# Patient Record
Sex: Male | Born: 1970 | Race: Black or African American | Hispanic: No | Marital: Single | Smoking: Current some day smoker
Health system: Southern US, Community
[De-identification: ages and names within clinical notes are randomized; demographics above are authoritative.]

## PROBLEM LIST (undated history)

## (undated) DIAGNOSIS — E7800 Pure hypercholesterolemia, unspecified: Secondary | ICD-10-CM

---

## 2011-04-27 ENCOUNTER — Emergency Department: Payer: Self-pay | Admitting: Emergency Medicine

## 2012-05-13 ENCOUNTER — Emergency Department: Payer: Self-pay | Admitting: Emergency Medicine

## 2012-05-15 ENCOUNTER — Emergency Department: Payer: Self-pay | Admitting: Emergency Medicine

## 2012-05-18 LAB — WOUND CULTURE

## 2012-12-28 ENCOUNTER — Emergency Department: Payer: Self-pay | Admitting: Emergency Medicine

## 2012-12-28 LAB — CBC
HCT: 42.4 % (ref 40.0–52.0)
HGB: 14.2 g/dL (ref 13.0–18.0)
MCH: 29.2 pg (ref 26.0–34.0)
MCHC: 33.4 g/dL (ref 32.0–36.0)
Platelet: 215 10*3/uL (ref 150–440)
RBC: 4.85 10*6/uL (ref 4.40–5.90)
WBC: 8.2 10*3/uL (ref 3.8–10.6)

## 2012-12-28 LAB — COMPREHENSIVE METABOLIC PANEL
Bilirubin,Total: 0.4 mg/dL (ref 0.2–1.0)
Co2: 29 mmol/L (ref 21–32)
Creatinine: 0.96 mg/dL (ref 0.60–1.30)
Glucose: 122 mg/dL — ABNORMAL HIGH (ref 65–99)
Osmolality: 282 (ref 275–301)
Potassium: 3.8 mmol/L (ref 3.5–5.1)
SGOT(AST): 26 U/L (ref 15–37)
SGPT (ALT): 23 U/L (ref 12–78)
Sodium: 140 mmol/L (ref 136–145)
Total Protein: 7.7 g/dL (ref 6.4–8.2)

## 2012-12-28 LAB — TROPONIN I: Troponin-I: <0.02 ng/mL

## 2013-05-29 ENCOUNTER — Emergency Department: Payer: Self-pay | Admitting: Emergency Medicine

## 2015-05-29 ENCOUNTER — Other Ambulatory Visit: Payer: Self-pay | Admitting: Family Medicine

## 2015-05-29 DIAGNOSIS — R1033 Periumbilical pain: Secondary | ICD-10-CM

## 2015-06-04 ENCOUNTER — Ambulatory Visit
Admission: RE | Admit: 2015-06-04 | Discharge: 2015-06-04 | Disposition: A | Discharge status: Home / Self Care | Payer: No Typology Code available for payment source | Source: Ambulatory Visit | Attending: Family Medicine | Admitting: Family Medicine

## 2015-06-04 ENCOUNTER — Other Ambulatory Visit: Payer: Self-pay | Admitting: Family Medicine

## 2015-06-04 DIAGNOSIS — N2889 Other specified disorders of kidney and ureter: Secondary | ICD-10-CM | POA: Diagnosis present

## 2015-06-04 DIAGNOSIS — N281 Cyst of kidney, acquired: Secondary | ICD-10-CM | POA: Diagnosis not present

## 2015-06-04 DIAGNOSIS — R1033 Periumbilical pain: Secondary | ICD-10-CM

## 2015-06-04 MED ORDER — IOHEXOL 350 MG/ML SOLN
100.0000 mL | Freq: Once | INTRAVENOUS | Status: AC | PRN
  Administered 2015-06-04: 100 mL via INTRAVENOUS

## 2015-06-11 ENCOUNTER — Ambulatory Visit
Admission: RE | Admit: 2015-06-11 | Discharge: 2015-06-11 | Disposition: A | Discharge status: Home / Self Care | Payer: No Typology Code available for payment source | Source: Ambulatory Visit | Attending: Family Medicine | Admitting: Family Medicine

## 2015-06-11 DIAGNOSIS — N281 Cyst of kidney, acquired: Secondary | ICD-10-CM | POA: Diagnosis not present

## 2015-06-11 DIAGNOSIS — N2889 Other specified disorders of kidney and ureter: Secondary | ICD-10-CM | POA: Diagnosis present

## 2015-06-11 MED ORDER — GADOBENATE DIMEGLUMINE 529 MG/ML IV SOLN
20.0000 mL | Freq: Once | INTRAVENOUS | Status: AC | PRN
  Administered 2015-06-11: 16 mL via INTRAVENOUS

## 2016-11-05 IMAGING — CT CT ABD-PELV W/ CM
2 of 5 series · 15 of 46 positions shown, 17 images · IV contrast (omnipaque)
Comparison: None.

CLINICAL DATA: Patient complains of pelvic pain for 6 months.

EXAM:
CT ABDOMEN AND PELVIS WITH CONTRAST
TECHNIQUE: Multidetector CT imaging of the abdomen and pelvis was performed
using the standard protocol following bolus administration of
intravenous contrast.
CONTRAST:  100mL OMNIPAQUE IOHEXOL 350 MG/ML SOLN

[Series 3: routine with · axial · 0.67mm/px · z∈[-1070,-670]mm · 12 of 91 slices shown, 14 images]
[im 6/91  soft-tissue]
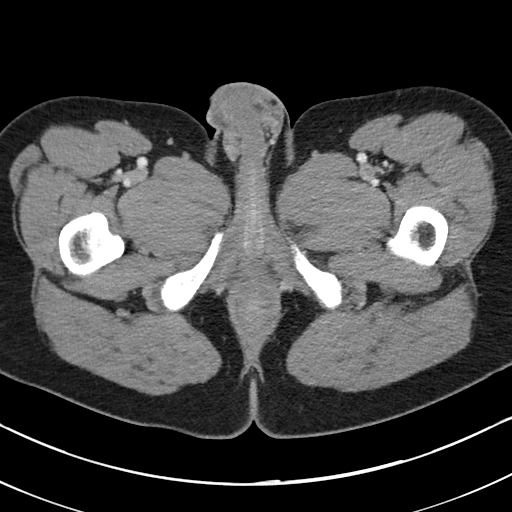
[im 6/91  bone]
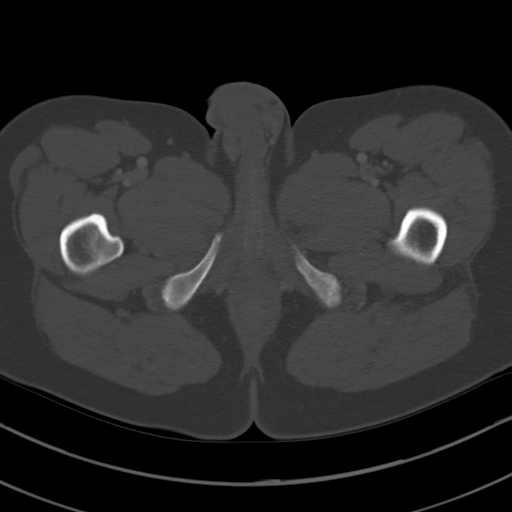
[im 16/91  soft-tissue]
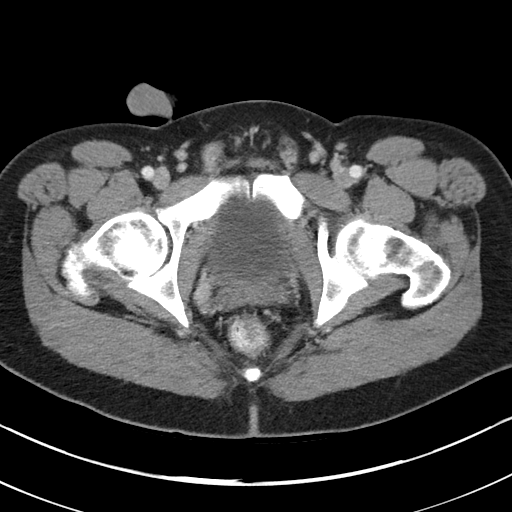
[im 21/91  soft-tissue]
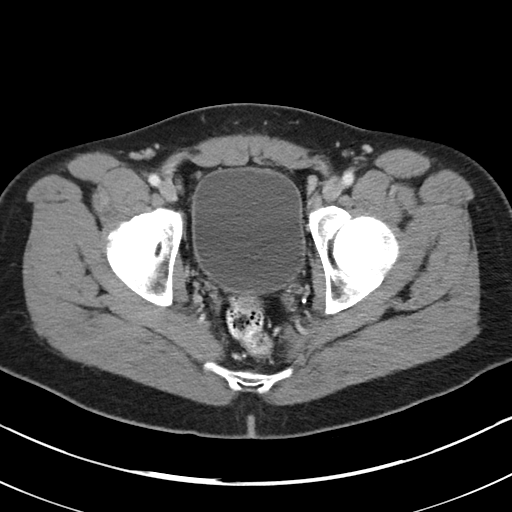
[im 26/91  soft-tissue]
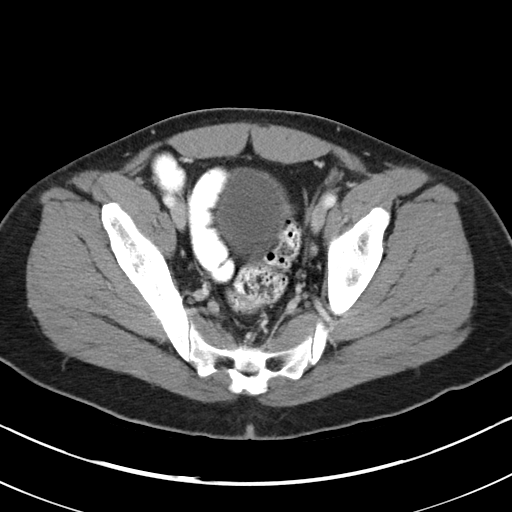
[im 36/91  soft-tissue]
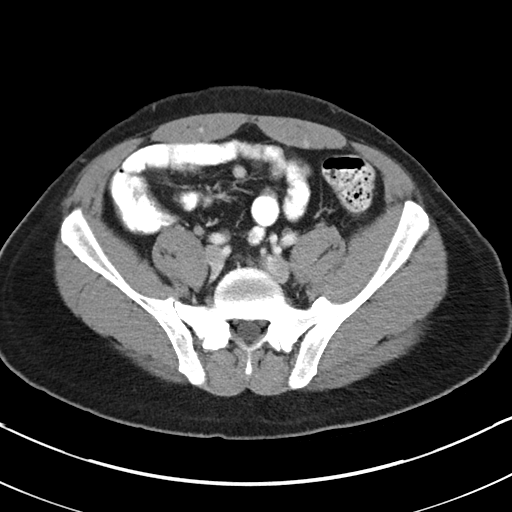
[im 41/91  soft-tissue]
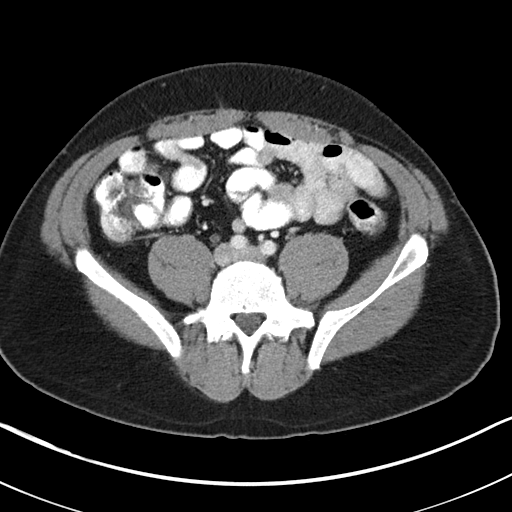
[im 51/91  soft-tissue]
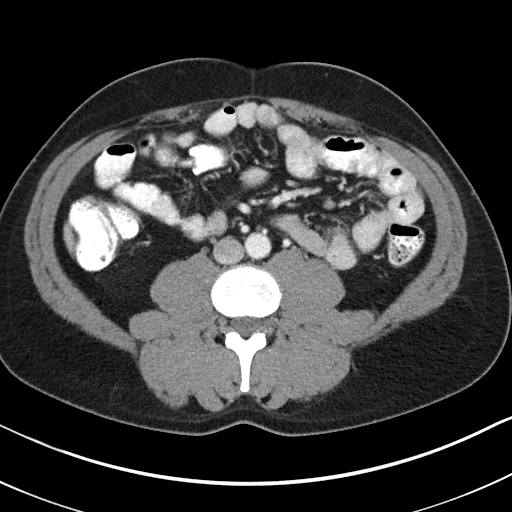
[im 56/91  soft-tissue]
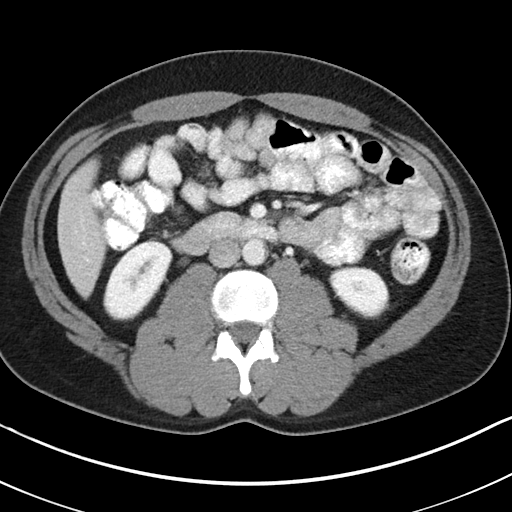
[im 66/91  soft-tissue]
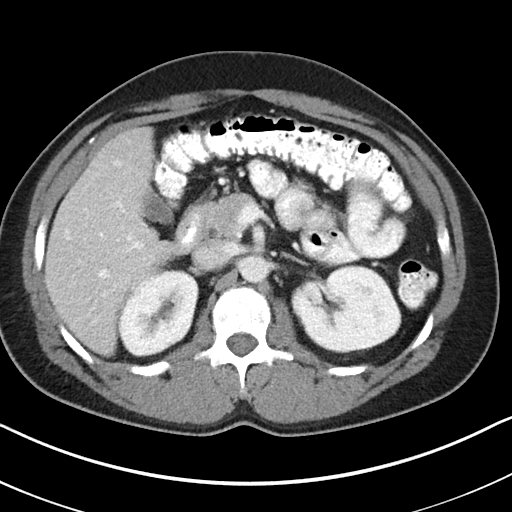
[im 66/91  bone]
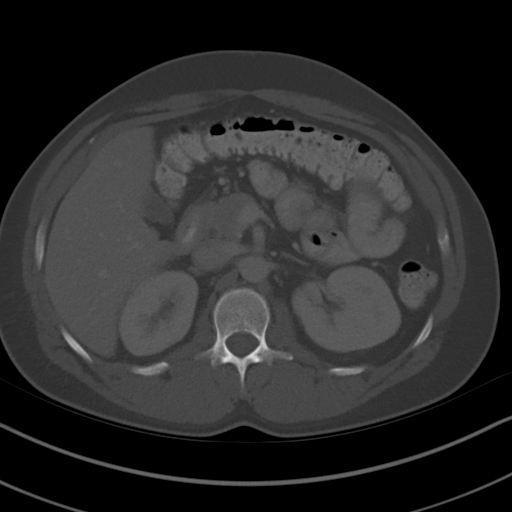
[im 71/91  soft-tissue]
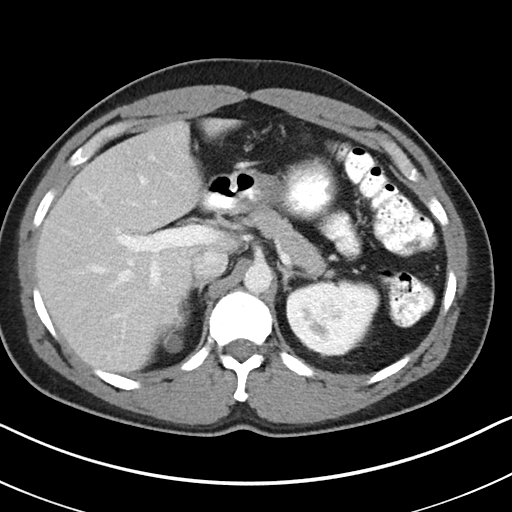
[im 76/91  soft-tissue]
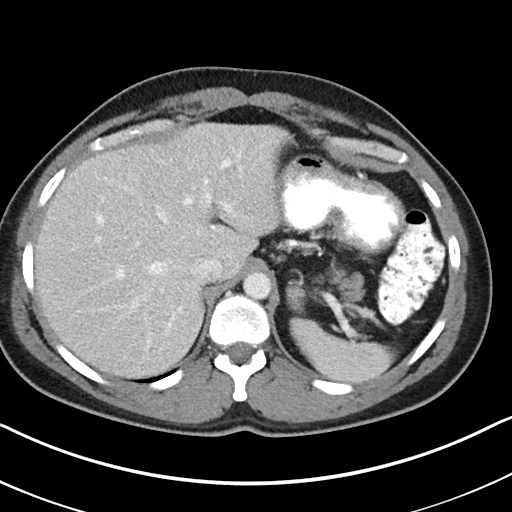
[im 86/91  soft-tissue]
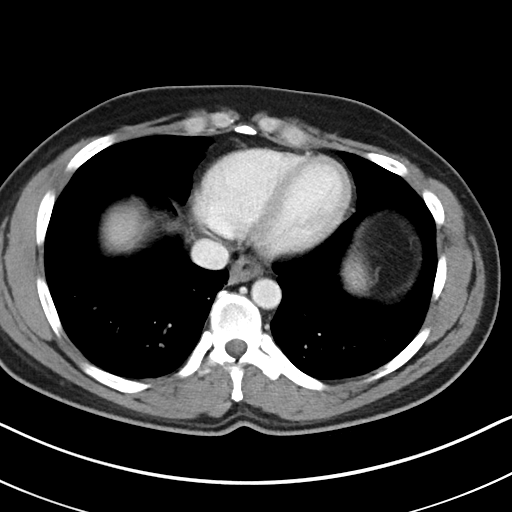

[Series 6: cor routine with · coronal · 0.62mm/px · 3 of 151 slices shown]
[im 51/151  soft-tissue]
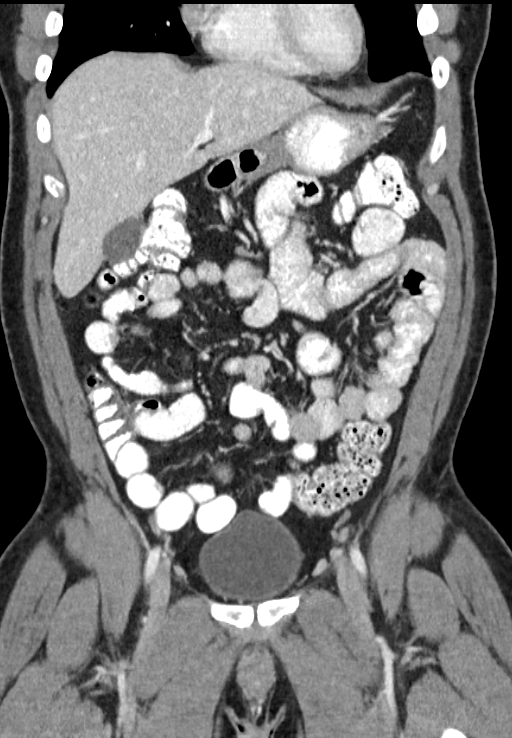
[im 67/151  soft-tissue]
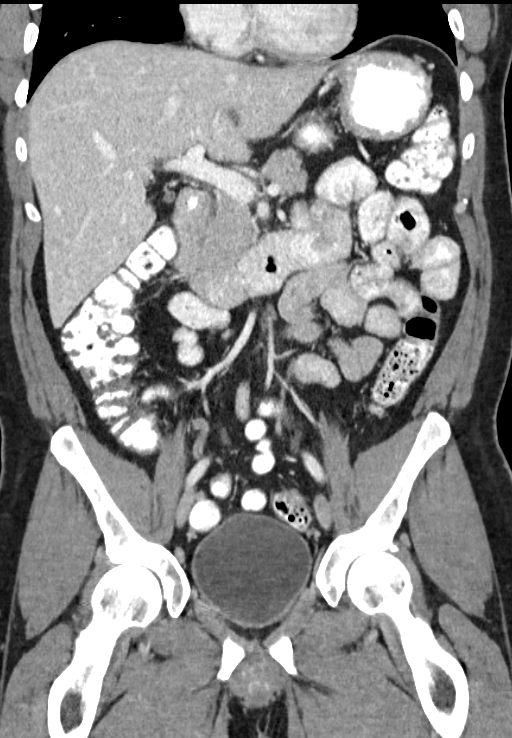
[im 84/151  soft-tissue]
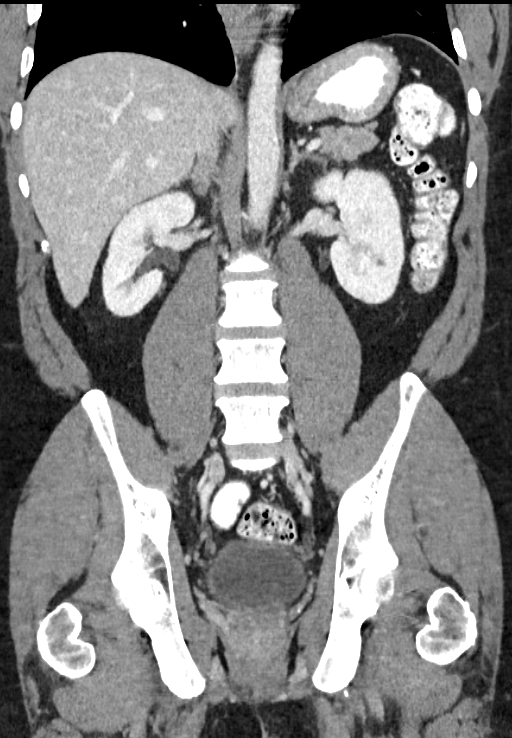

[15 of 46 positions shown; findings below may reference images not displayed]

FINDINGS: Lower chest:  Unremarkable.

Hepatobiliary: No masses or other significant abnormality
identified.

Pancreas: No evidence of mass, inflammatory changes, or other
significant abnormality.

Spleen:  Within normal limits in size and appearance.

Adrenal Glands:  No masses identified.

Kidneys/Urinary Tract: No evidence of urolithiasis or
hydronephrosis. There is a 1.5 x 1.3 x 1.3 cm circumscribed
hypoattenuated mass, partially exophytic off of the posterior
superior pole of the right kidney, measuring 20-25 Hounsfield units
in density. There is also a 7 mm circumscribed hypoattenuated nodule
within the anterior lower pole of the left kidney. No masses or
calculi seen involving the lower urinary tract.

Stomach/Bowel/Peritoneum: No evidence of wall thickening, mass, or
obstruction. The appendix is long but has otherwise normal
appearance.

Vascular/Lymphatic: No pathologically enlarged lymph nodes
identified. No other significant abnormality noted.

Reproductive: No masses or other significant abnormality identified.
Coarse calcification within the prostate is seen.

Other:  There is a tiny few mm fat containing umbilical hernia.

Musculoskeletal:  No suspicious bone lesions identified.
IMPRESSION: 1.5 cm partially exophytic right superior pole renal mass, which
does not satisfy the criteria for simple cyst. Solid renal mass
cannot be excluded.

Further evaluation with pre and post contrast MRI should be
considered. Pre and post contrast CT could alternatively be
performed, but would likely be of decreased accuracy given lesion
size.

7 mm left renal cyst.

No evidence of other acute abdominal or pelvic process.

These results will be called to the ordering clinician or
representative by the Radiologist Assistant, and communication
documented in the PACS or zVision Dashboard.

## 2017-03-13 DIAGNOSIS — H15103 Unspecified episcleritis, bilateral: Secondary | ICD-10-CM | POA: Diagnosis not present

## 2017-03-30 DIAGNOSIS — R21 Rash and other nonspecific skin eruption: Secondary | ICD-10-CM | POA: Diagnosis not present

## 2017-08-16 DIAGNOSIS — Z0000 Encounter for general adult medical examination without abnormal findings: Secondary | ICD-10-CM | POA: Diagnosis not present

## 2017-08-16 DIAGNOSIS — Z23 Encounter for immunization: Secondary | ICD-10-CM | POA: Diagnosis not present

## 2017-08-16 DIAGNOSIS — E7800 Pure hypercholesterolemia, unspecified: Secondary | ICD-10-CM | POA: Diagnosis not present

## 2017-08-16 DIAGNOSIS — R7303 Prediabetes: Secondary | ICD-10-CM | POA: Diagnosis not present

## 2017-08-31 DIAGNOSIS — R7303 Prediabetes: Secondary | ICD-10-CM | POA: Diagnosis not present

## 2017-08-31 DIAGNOSIS — E7800 Pure hypercholesterolemia, unspecified: Secondary | ICD-10-CM | POA: Diagnosis not present

## 2017-08-31 DIAGNOSIS — R1032 Left lower quadrant pain: Secondary | ICD-10-CM | POA: Diagnosis not present

## 2017-08-31 DIAGNOSIS — Z0000 Encounter for general adult medical examination without abnormal findings: Secondary | ICD-10-CM | POA: Diagnosis not present

## 2018-01-23 DIAGNOSIS — Z0000 Encounter for general adult medical examination without abnormal findings: Secondary | ICD-10-CM | POA: Diagnosis not present

## 2018-09-20 DIAGNOSIS — Z23 Encounter for immunization: Secondary | ICD-10-CM | POA: Diagnosis not present

## 2020-01-26 ENCOUNTER — Ambulatory Visit: Payer: No Typology Code available for payment source | Attending: Internal Medicine

## 2020-01-26 DIAGNOSIS — Z23 Encounter for immunization: Secondary | ICD-10-CM

## 2020-01-26 NOTE — Progress Notes (Signed)
   Covid-19 Vaccination Clinic  Name:  Benjamin Taylor    MRN: [id number] DOB: 23-Feb-1971  01/26/2020  [name] was observed post Covid-19 immunization for 15 minutes without incident. He was provided with Vaccine Information Sheet and instruction to access the V-Safe system.   [name] was instructed to call 911 with any severe reactions post vaccine: Marland Kitchen Difficulty breathing  . Swelling of face and throat  . A fast heartbeat  . A bad rash all over body  . Dizziness and weakness   Immunizations Administered    Name Date Dose VIS Date Route   Pfizer COVID-19 Vaccine 01/26/2020 10:30 AM 0.3 mL 10/11/2019 Intramuscular   Manufacturer: [employer], [employer]   Lot: [id number]   NDC: [address]-3

## 2020-02-18 ENCOUNTER — Ambulatory Visit: Payer: No Typology Code available for payment source | Attending: Internal Medicine

## 2020-02-18 DIAGNOSIS — Z23 Encounter for immunization: Secondary | ICD-10-CM

## 2020-02-18 NOTE — Progress Notes (Signed)
   Covid-19 Vaccination Clinic  Name:  Benjamin Taylor    MRN: [id number] DOB: 1971/05/18  02/18/2020  [name] was observed post Covid-19 immunization for 15 minutes without incident. He was provided with Vaccine Information Sheet and instruction to access the V-Safe system.   [name] was instructed to call 911 with any severe reactions post vaccine: Marland Kitchen Difficulty breathing  . Swelling of face and throat  . A fast heartbeat  . A bad rash all over body  . Dizziness and weakness   Immunizations Administered    Name Date Dose VIS Date Route   Pfizer COVID-19 Vaccine 02/18/2020  3:45 PM 0.3 mL 12/25/2018 Intramuscular   Manufacturer: [employer], [employer]   Lot: [id number]   NDC: [address]-9

## 2021-01-24 ENCOUNTER — Other Ambulatory Visit: Payer: Self-pay

## 2021-01-24 ENCOUNTER — Ambulatory Visit
Admission: EM | Admit: 2021-01-24 | Discharge: 2021-01-24 | Disposition: A | Discharge status: Home / Self Care | Payer: BC Managed Care – PPO | Attending: Family Medicine | Admitting: Family Medicine

## 2021-01-24 ENCOUNTER — Encounter: Payer: Self-pay | Admitting: Emergency Medicine

## 2021-01-24 DIAGNOSIS — M7712 Lateral epicondylitis, left elbow: Secondary | ICD-10-CM

## 2021-01-24 MED ORDER — MELOXICAM 15 MG PO TABS: 15.0000 mg | ORAL_TABLET | Freq: Every day | ORAL | 0 refills | Status: DC | PRN

## 2021-01-24 NOTE — ED Triage Notes (Signed)
Patient c/o left elbow pain that radiates up his left upper arm since Friday.  Patient denies any injury or fall.  Patient reports pain in the muscle and when bending his left elbow that pain is worse.

## 2021-01-24 NOTE — Discharge Instructions (Signed)
Rest, ice.  Medication as prescribed.  If persists, see Ortho. [facility] ([phone]) OR [facility] ([phone])  Take care  Dr. Chantalle Defilippo   

## 2021-01-24 NOTE — ED Provider Notes (Signed)
[facility]    CSN: [id number] Arrival date & time: 01/24/21  1251      History   Chief Complaint Chief Complaint  Patient presents with  . Elbow Pain    left   HPI  50 year old male presents with the above complaint.  Patient states that this has been going on for the past few months.  Worsened Friday.  He reports left elbow pain.  He also reports pain of the left tricep.  No fall, trauma, injury.  He does note that he does repetitive activity with this left extremity at work.  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  No fall, trauma, injury.  No relieving factors.  Worse with activity.  He states that his pain is severe.  Home Medications    Prior to Admission medications   Medication Sig Start Date End Date Taking? Authorizing Provider  cyclobenzaprine (FLEXERIL) 5 MG tablet Take by mouth. 08/20/20  Yes [provider]  meloxicam (MOBIC) 15 MG tablet Take 1 tablet (15 mg total) by mouth daily as needed. 01/24/21  Yes Tommie Sams, DO    Family History History reviewed. No pertinent family history.  Social History Social History   Tobacco Use  . Smoking status: Current Some Day Smoker    Types: E-cigarettes  . Smokeless tobacco: Never Used  Substance Use Topics  . Alcohol use: Yes     Allergies   Tetracyclines & related   Review of Systems Review of Systems  Constitutional: Negative.   Musculoskeletal:       Left elbow pain.   Physical Exam Triage Vital Signs ED Triage Vitals  Enc Vitals Group     BP 01/24/21 1313 (!) 143/93     Pulse Rate 01/24/21 1313 90     Resp 01/24/21 1313 16     Temp 01/24/21 1313 98.1 F (36.7 C)     Temp Source 01/24/21 1313 Oral     SpO2 01/24/21 1313 100 %     Weight 01/24/21 1310 180 lb (81.6 kg)     Height 01/24/21 1310 5\' 7"  (1.702 m)     Head Circumference --      Peak Flow --      Pain Score 01/24/21 1310 10     Pain Loc --      Pain Edu? --      Excl. in [place]? --     Updated Vital Signs BP (!) 143/93 (BP Location: Right Arm)   Pulse 90   Temp 98.1 F (36.7 C) (Oral)   Resp 16   Ht 5\' 7"  (1.702 m)   Wt 81.6 kg   SpO2 100%   BMI 28.19 kg/m   Visual Acuity Right Eye Distance:   Left Eye Distance:   Bilateral Distance:    Right Eye Near:   Left Eye Near:    Bilateral Near:     Physical Exam Vitals and nursing note reviewed.  Constitutional:      General: He is not in acute distress.    Appearance: Normal appearance. He is not ill-appearing.  HENT:     Head: Normocephalic and atraumatic.  Eyes:     General:        Right eye: No discharge.        Left eye: No discharge.     Conjunctiva/sclera: Conjunctivae normal.  Pulmonary:     Effort: Pulmonary effort is normal. No respiratory distress.  Musculoskeletal:  Comments: Left elbow -tenderness over the lateral epicondyle. Patient also has a discrete area of tenderness of the distal tricep near its attachment site.  Neurological:     Mental Status: He is alert.  Psychiatric:        Mood and Affect: Mood normal.        Behavior: Behavior normal.    UC Treatments / Results  Labs (all labs ordered are listed, but only abnormal results are displayed) Labs Reviewed - No data to display  EKG   Radiology No results found.  Procedures Procedures (including critical care time)  Medications Ordered in UC Medications - No data to display  Initial Impression / Assessment and Plan / UC Course  I have reviewed the triage vital signs and the nursing notes.  Pertinent labs & imaging results that were available during my care of the patient were reviewed by me and considered in my medical decision making (see chart for details).    50 year old male presents with lateral epicondylitis.  Patient also having pain of the left tricep.  Placing a meloxicam.  Advised rest, ice.  Information given regarding orthopedics.  Supportive care.  Final Clinical Impressions(s) / UC Diagnoses    Final diagnoses:  Lateral epicondylitis of left elbow     Discharge Instructions     Rest, ice.  Medication as prescribed.  If persists, see Ortho. [facility] [phone]) OR [facility] [phone])  Take care  Dr. Adriana Simas     ED Prescriptions    Medication Sig Dispense Auth. Provider   meloxicam (MOBIC) 15 MG tablet Take 1 tablet (15 mg total) by mouth daily as needed. 30 tablet Tommie Sams, DO     PDMP not reviewed this encounter.   Tommie Sams, [place] 01/24/21 1637

## 2021-07-13 ENCOUNTER — Encounter: Payer: Self-pay | Admitting: Emergency Medicine

## 2023-01-04 ENCOUNTER — Encounter: Payer: Self-pay | Admitting: Emergency Medicine

## 2023-01-04 ENCOUNTER — Ambulatory Visit
Admission: EM | Admit: 2023-01-04 | Discharge: 2023-01-04 | Disposition: A | Discharge status: Home / Self Care | Payer: No Typology Code available for payment source | Attending: Physician Assistant | Admitting: Physician Assistant

## 2023-01-04 DIAGNOSIS — R051 Acute cough: Secondary | ICD-10-CM | POA: Insufficient documentation

## 2023-01-04 DIAGNOSIS — U071 COVID-19: Secondary | ICD-10-CM | POA: Insufficient documentation

## 2023-01-04 DIAGNOSIS — R52 Pain, unspecified: Secondary | ICD-10-CM | POA: Diagnosis present

## 2023-01-04 LAB — RESP PANEL BY RT-PCR (RSV, FLU A&B, COVID)  RVPGX2
Influenza A by PCR: NEGATIVE
Influenza B by PCR: NEGATIVE
Resp Syncytial Virus by PCR: NEGATIVE
SARS Coronavirus 2 by RT PCR: POSITIVE — AB

## 2023-01-04 NOTE — Discharge Instructions (Addendum)
+  COVID test -Isolate 5 days and then wear mask for 5 days.  You can come out of isolation on Sunday. - Increase rest fluids.  Take OTC cough meds, Tylenol as needed for symptoms. -You need to be seen again if you have any uncontrollable fever, weakness or breathing difficulty.

## 2023-01-04 NOTE — ED Provider Notes (Signed)
[facility]    CSN: [id number] Arrival date & time: 01/04/23  [id number]      History   Chief Complaint Chief Complaint  Patient presents with   Generalized Body Aches   Cough    HPI Benjamin Taylor is a 52 y.o. male presenting for approximately 2-day history of bodyaches, fatigue, cough, congestion and runny nose.  Patient has not recorded a temperature.  Denies headache, sore throat, breathing difficulty, vomiting or diarrhea.  No report of any sick contacts or known exposure to COVID or flu.  Has been taking Theraflu and Tylenol for symptoms.  No other complaints or concerns.  HPI  History reviewed. No pertinent past medical history.  There are no problems to display for this patient.   History reviewed. No pertinent surgical history.     Home Medications    Prior to Admission medications   Medication Sig Start Date End Date Taking? Authorizing Provider  cyclobenzaprine (FLEXERIL) 5 MG tablet Take by mouth. 08/20/20  Yes [provider]  rosuvastatin (CRESTOR) 5 MG tablet Take by mouth. 12/29/22 12/29/23 Yes [provider]  meloxicam (MOBIC) 15 MG tablet Take 1 tablet (15 mg total) by mouth daily as needed. 01/24/21   Coral Spikes, DO    Family History No family history on file.  Social History Social History   Tobacco Use   Smoking status: Some Days    Types: Cigars   Smokeless tobacco: Never  Vaping Use   Vaping Use: Former  Substance Use Topics   Alcohol use: Yes   Drug use: Not Currently     Allergies   Tetracyclines & related   Review of Systems Review of Systems  Constitutional:  Positive for fatigue. Negative for fever.  HENT:  Positive for congestion and rhinorrhea. Negative for sinus pressure, sinus pain and sore throat.   Respiratory:  Positive for cough. Negative for shortness of breath.   Cardiovascular:  Negative for chest pain.  Gastrointestinal:  Negative for abdominal pain, diarrhea, nausea and vomiting.   Musculoskeletal:  Positive for myalgias.  Neurological:  Negative for weakness, light-headedness and headaches.  Hematological:  Negative for adenopathy.     Physical Exam Triage Vital Signs ED Triage Vitals  Enc Vitals Group     BP      Pulse      Resp      Temp      Temp src      SpO2      Weight      Height      Head Circumference      Peak Flow      Pain Score      Pain Loc      Pain Edu?      Excl. in [place]?    No data found.  Updated Vital Signs BP (!) 145/103 (BP Location: Right Arm)   Pulse (!) 109   Temp 98.9 F (37.2 C) (Oral)   Resp 18   Ht '5\' 7"'$  (1.702 m)   Wt 179 lb 14.3 oz (81.6 kg)   SpO2 97%   BMI 28.18 kg/m     Physical Exam Vitals and nursing note reviewed.  Constitutional:      General: He is not in acute distress.    Appearance: Normal appearance. He is well-developed. He is not ill-appearing.  HENT:     Head: Normocephalic and atraumatic.     Nose: Congestion present.     Mouth/Throat:  Mouth: Mucous membranes are moist.     Pharynx: Oropharynx is clear.  Eyes:     General: No scleral icterus.    Conjunctiva/sclera: Conjunctivae normal.  Cardiovascular:     Rate and Rhythm: Regular rhythm. Tachycardia present.     Heart sounds: Normal heart sounds.  Pulmonary:     Effort: Pulmonary effort is normal. No respiratory distress.     Breath sounds: Normal breath sounds.  Musculoskeletal:     Cervical back: Neck supple.  Skin:    General: Skin is warm and dry.     Capillary Refill: Capillary refill takes less than 2 seconds.  Neurological:     General: No focal deficit present.     Mental Status: He is alert. Mental status is at baseline.     Motor: No weakness.     Gait: Gait normal.  Psychiatric:        Mood and Affect: Mood normal.        Behavior: Behavior normal.      UC Treatments / Results  Labs (all labs ordered are listed, but only abnormal results are displayed) Labs Reviewed  RESP PANEL BY RT-PCR (RSV, FLU  A&B, COVID)  RVPGX2 - Abnormal; Notable for the following components:      Result Value   SARS Coronavirus 2 by RT PCR POSITIVE (*)    All other components within normal limits    EKG   Radiology No results found.  Procedures Procedures (including critical care time)  Medications Ordered in UC Medications - No data to display  Initial Impression / Assessment and Plan / UC Course  I have reviewed the triage vital signs and the nursing notes.  Pertinent labs & imaging results that were available during my care of the patient were reviewed by me and considered in my medical decision making (see chart for details).   52 year old male presents for 2-day history of fatigue, cough, congestion and bodyaches.  He is afebrile.  He is tachycardic at 109 bpm.  Mild nasal congestion. Chest clear to auscultation heart regular rhythm.  Respiratory panel obtained.  COVID positive.  Reviewed results with patient.  Reviewed current CDC guidelines, isolation protocol and ED precautions.  Offered medications but he declines.  Supportive care encouraged with increasing rest and fluids.  Reviewed return and ER precautions.   Final Clinical Impressions(s) / UC Diagnoses   Final diagnoses:  COVID-19  Acute cough  Body aches     Discharge Instructions      +COVID test -Isolate 5 days and then wear mask for 5 days.  You can come out of isolation on Sunday. - Increase rest fluids.  Take OTC cough meds, Tylenol as needed for symptoms. -You need to be seen again if you have any uncontrollable fever, weakness or breathing difficulty.     ED Prescriptions   None    PDMP not reviewed this encounter.   Danton Clap, PA-C 01/04/23 1116

## 2023-01-04 NOTE — ED Triage Notes (Signed)
Pt c/o cough, body aches, runny nose. Started about 2 days ago. Unsure if he has had a fever.

## 2024-04-09 ENCOUNTER — Ambulatory Visit
Admission: EM | Admit: 2024-04-09 | Discharge: 2024-04-09 | Disposition: A | Discharge status: Home / Self Care | Attending: Emergency Medicine | Admitting: Emergency Medicine

## 2024-04-09 DIAGNOSIS — S39012A Strain of muscle, fascia and tendon of lower back, initial encounter: Secondary | ICD-10-CM | POA: Diagnosis not present

## 2024-04-09 HISTORY — DX: Pure hypercholesterolemia, unspecified: E78.00

## 2024-04-09 MED ORDER — IBUPROFEN 600 MG PO TABS: 600.0000 mg | ORAL_TABLET | Freq: Four times a day (QID) | ORAL | 0 refills | Status: AC | PRN

## 2024-04-09 MED ORDER — BACLOFEN 10 MG PO TABS: 10.0000 mg | ORAL_TABLET | Freq: Three times a day (TID) | ORAL | 0 refills | Status: AC

## 2024-04-09 NOTE — ED Provider Notes (Signed)
 [facility]    CSN: [id number] Arrival date & time: 04/09/24  1102      History   Chief Complaint Chief Complaint  Patient presents with   Back Pain    HPI Benjamin Taylor is a 53 y.o. male.   HPI  53 year old male with past medical history significant for high cholesterol presents for evaluation of bilateral low back pain that has been going on for the past week.  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  He does wear a back brace when at work and he states that after he starts working the pain improves but then it returns the following day.  The pain increases with standing and bending but does improve with rest and sitting.  Past Medical History:  Diagnosis Date   High cholesterol     There are no active problems to display for this patient.   History reviewed. No pertinent surgical history.     Home Medications    Prior to Admission medications   Medication Sig Start Date End Date Taking? Authorizing Provider  baclofen (LIORESAL) 10 MG tablet Take 1 tablet (10 mg total) by mouth 3 (three) times daily. 04/09/24  Yes Kent Pear, NP  ibuprofen (ADVIL) 600 MG tablet Take 1 tablet (600 mg total) by mouth every 6 (six) hours as needed. 04/09/24  Yes Kent Pear, NP  rosuvastatin (CRESTOR) 5 MG tablet Take by mouth. 12/29/22 04/09/24 Yes [provider]    Family History History reviewed. No pertinent family history.  Social History Social History   Tobacco Use   Smoking status: Some Days    Types: Cigars   Smokeless tobacco: Never  Vaping Use   Vaping status: Former  Substance Use Topics   Alcohol use: Yes   Drug use: Not Currently     Allergies   Tetracyclines & related   Review of Systems Review of Systems  Musculoskeletal:  Positive for back pain.  Neurological:  Negative for weakness and numbness.     Physical Exam Triage Vital Signs ED Triage Vitals [04/09/24 1118]  Encounter Vitals  Group     BP      Systolic BP Percentile      Diastolic BP Percentile      Pulse      Resp      Temp      Temp src      SpO2      Weight      Height      Head Circumference      Peak Flow      Pain Score 10     Pain Loc      Pain Education      Exclude from Growth Chart    No data found.  Updated Vital Signs BP (!) 174/93 (BP Location: Right Arm)   Pulse 88   Temp 98.5 F (36.9 C) (Oral)   Resp 15   SpO2 98%   Visual Acuity Right Eye Distance:   Left Eye Distance:   Bilateral Distance:    Right Eye Near:   Left Eye Near:    Bilateral Near:     Physical Exam Vitals and nursing note reviewed.  Constitutional:      Appearance: Normal appearance. He is not ill-appearing.  HENT:     Head: Normocephalic and atraumatic.  Musculoskeletal:        General: Tenderness present.  Skin:    General: Skin is  warm and dry.     Capillary Refill: Capillary refill takes less than 2 seconds.  Neurological:     General: No focal deficit present.     Mental Status: He is alert and oriented to person, place, and time.      UC Treatments / Results  Labs (all labs ordered are listed, but only abnormal results are displayed) Labs Reviewed - No data to display  EKG   Radiology No results found.  Procedures Procedures (including critical care time)  Medications Ordered in UC Medications - No data to display  Initial Impression / Assessment and Plan / UC Course  I have reviewed the triage vital signs and the nursing notes.  Pertinent labs & imaging results that were available during my care of the patient were reviewed by me and considered in my medical decision making (see chart for details).   Patient is a nontoxic-appearing 53 year old male presenting for evaluation of bilateral low back pain as outlined in the HPI above.  In the exam room his back is in normal axial alignment and there is no spinous process tenderness or step-off in his lumbar spine.  In the lateral  aspect of the lumbar paraspinous region bilaterally he does have muscle tenderness to palpation.  No definitive spasm noted.  The pain does increase with lateral rotation as well as forward flexion and extension.  I will treat the patient for lumbar strain with 600 mg of ibuprofen every 6 hours with food along with 10 mg of baclofen every 8 hours.  Also rest and home physical therapy exercises.  Return precautions reviewed.  Work note provided.   Final Clinical Impressions(s) / UC Diagnoses   Final diagnoses:  Strain of lumbar region, initial encounter     Discharge Instructions      Take the ibuprofen, 600 mg every 6 hours with food, on a schedule for the next 48 hours and then as needed.  Take the baclofen, 10 mg every 8 hours, on a schedule for the next 48 hours and then as needed.  Stop your meloxicam  and cyclobenzaprine while taking these 2 medications.  Apply moist heat to your back for 30 minutes at a time 2-3 times a day to improve blood flow to the area and help remove the lactic acid causing the spasm.  Follow the back exercises given at discharge.  Return for reevaluation for any new or worsening symptoms.    ED Prescriptions     Medication Sig Dispense Auth. Provider   baclofen (LIORESAL) 10 MG tablet Take 1 tablet (10 mg total) by mouth 3 (three) times daily. 30 each Kent Pear, NP   ibuprofen (ADVIL) 600 MG tablet Take 1 tablet (600 mg total) by mouth every 6 (six) hours as needed. 30 tablet Kent Pear, NP      I have reviewed the PDMP during this encounter.   Kent Pear, NP 04/09/24 1133

## 2024-04-09 NOTE — Discharge Instructions (Signed)
 Take the ibuprofen, 600 mg every 6 hours with food, on a schedule for the next 48 hours and then as needed.  Take the baclofen, 10 mg every 8 hours, on a schedule for the next 48 hours and then as needed.  Stop your meloxicam  and cyclobenzaprine while taking these 2 medications.  Apply moist heat to your back for 30 minutes at a time 2-3 times a day to improve blood flow to the area and help remove the lactic acid causing the spasm.  Follow the back exercises given at discharge.  Return for reevaluation for any new or worsening symptoms.

## 2024-04-09 NOTE — ED Triage Notes (Signed)
 Patient states that he works at [employer] ex and [employer]. Patient states that he's having lower back pain x 1 week. Hurts to stand and bend.
